# Patient Record
Sex: Male | Born: 1980 | Race: White | Hispanic: No | Marital: Married | Smoking: Current every day smoker
Health system: Southern US, Community
[De-identification: ages and names within clinical notes are randomized; demographics above are authoritative.]

## PROBLEM LIST (undated history)

## (undated) DIAGNOSIS — F32A Depression, unspecified: Secondary | ICD-10-CM

## (undated) DIAGNOSIS — F329 Major depressive disorder, single episode, unspecified: Secondary | ICD-10-CM

## (undated) DIAGNOSIS — R011 Cardiac murmur, unspecified: Secondary | ICD-10-CM

## (undated) HISTORY — DX: Cardiac murmur, unspecified: R01.1

## (undated) HISTORY — DX: Depression, unspecified: F32.A

## (undated) HISTORY — DX: Major depressive disorder, single episode, unspecified: F32.9

---

## 2012-09-16 ENCOUNTER — Encounter: Payer: Self-pay | Admitting: Family Medicine

## 2012-09-16 ENCOUNTER — Ambulatory Visit (INDEPENDENT_AMBULATORY_CARE_PROVIDER_SITE_OTHER): Payer: PRIVATE HEALTH INSURANCE | Admitting: Family Medicine

## 2012-09-16 VITALS — BP 140/96 | HR 75 | Temp 99.0°F | Ht 76.0 in | Wt 220.5 lb

## 2012-09-16 DIAGNOSIS — Z131 Encounter for screening for diabetes mellitus: Secondary | ICD-10-CM

## 2012-09-16 DIAGNOSIS — R5383 Other fatigue: Secondary | ICD-10-CM

## 2012-09-16 DIAGNOSIS — Z13220 Encounter for screening for lipoid disorders: Secondary | ICD-10-CM

## 2012-09-16 DIAGNOSIS — R5381 Other malaise: Secondary | ICD-10-CM

## 2012-09-16 DIAGNOSIS — F329 Major depressive disorder, single episode, unspecified: Secondary | ICD-10-CM

## 2012-09-16 DIAGNOSIS — Z0000 Encounter for general adult medical examination without abnormal findings: Secondary | ICD-10-CM

## 2012-09-16 MED ORDER — FLUTICASONE PROPIONATE 50 MCG/ACT NA SUSP: 2.0000 | Freq: Every day | NASAL | Status: DC

## 2012-09-16 MED ORDER — TERBINAFINE HCL 250 MG PO TABS: 250.0000 mg | ORAL_TABLET | Freq: Every day | ORAL | Status: DC

## 2012-09-16 MED ORDER — FLUOXETINE HCL 20 MG PO TABS: 20.0000 mg | ORAL_TABLET | Freq: Every day | ORAL | Status: DC

## 2012-09-16 NOTE — Progress Notes (Signed)
Nature conservation officer at [facility] [address] [place] [address] Phone: [phone] Fax: [phone]  Date:  09/16/2012   Name:  Douglas Garza   DOB:  11-25-1980   MRN:  [id number] Gender: male Age: 32 y.o.  Primary Physician:  Hannah Beat, MD  Evaluating MD: Hannah Beat, MD   Chief Complaint: Establish Care   History of Present Illness:  Douglas Garza is a 32 y.o. pleasant patient who presents with the following:  Preventative Health Maintenance Visit:  Health Maintenance Summary Reviewed and updated, unless pt declines services.  Tobacco History Reviewed. Alcohol: No concerns, no excessive use Exercise Habits: Some activity, maybe once a week STD concerns: no risk or activity to increase risk Drug Use: None Encouraged self-testicular check  Rash - on underarms. Not completely gone.   Has a nasal thing. Maybe had a sinus infection. Funny -- initially was right only. Sometimes will have pain in teeth. Some ear fullness.  Went away for a week.   Having a lot of stress right now. Every once  In 10-11, on wellbutrin, placed.  Stressed a lot at work.  Wife and 2 boys, 3 and 11 months. Wife stays at home.  Not sleeping much.  Decreased interest and energy.  Unsure of guilt.    There are no active problems to display for this patient.   Past Medical History  Diagnosis Date  . Depression   . Heart murmur     No past surgical history on file.  History   Social History  . Marital Status: Married    Spouse Name: N/A    Number of Children: N/A  . Years of Education: N/A   Occupational History  . Not on file.   Social History Main Topics  . Smoking status: Current Every Day Smoker  . Smokeless tobacco: Never Used  . Alcohol Use: Yes  . Drug Use: No  . Sexually Active: Not on file   Other Topics Concern  . Not on file   Social History Narrative  . No narrative on file    No family history on file.  No Known  Allergies  Medication list has been reviewed and updated.  No outpatient prescriptions prior to visit.   No facility-administered medications prior to visit.    Review of Systems:   General: Denies fever, chills, sweats. No significant weight loss. Eyes: Denies blurring,significant itching ENT: Denies earache, sore throat, and hoarseness. Cardiovascular: Denies chest pains, palpitations, dyspnea on exertion Respiratory: Denies cough, dyspnea at rest,wheeezing Breast: no concerns about lumps GI: Denies nausea, vomiting, diarrhea, constipation, change in bowel habits, abdominal pain, melena, hematochezia GU: Denies penile discharge, ED, urinary flow / outflow problems. No STD concerns. Musculoskeletal: Denies back pain, joint pain Derm: rash as above Neuro: Denies  paresthesias, frequent falls, frequent headaches Psych: as above Endocrine: Denies cold intolerance, heat intolerance, polydipsia Heme: Denies enlarged lymph nodes Allergy: No hayfever   Physical Examination: BP 140/96  Pulse 75  Temp(Src) 99 F (37.2 C) (Oral)  Ht 6\' 4"  (1.93 m)  Wt 220 lb 8 oz (100.018 kg)  BMI 26.85 kg/m2  SpO2 99%  Ideal Body Weight: Weight in (lb) to have BMI = 25: 205   Wt Readings from Last 3 Encounters:  09/16/12 220 lb 8 oz (100.018 kg)    GEN: well developed, well nourished, no acute distress Eyes: conjunctiva and lids normal, PERRLA, EOMI ENT: TM clear, nares clear, oral exam WNL Neck: supple, no lymphadenopathy, no thyromegaly,  no JVD Pulm: clear to auscultation and percussion, respiratory effort normal CV: regular rate and rhythm, S1-S2, no murmur, rub or gallop, no bruits, peripheral pulses normal and symmetric, no cyanosis, clubbing, edema or varicosities Chest: no scars, masses GI: soft, non-tender; no hepatosplenomegaly, masses; active bowel sounds all quadrants GU: defer Lymph: no cervical, axillary or inguinal adenopathy MSK: gait normal, muscle tone and strength  WNL, no joint swelling, effusions, discoloration, crepitus  SKIN: macular rash under arms Neuro: normal mental status, normal strength, sensation, and motion Psych: alert; oriented to person, place and time, normally interactive and not anxious or depressed in appearance.   Assessment and Plan:  Routine general medical examination at a health care facility  Depression - Plan: FLUoxetine (PROZAC) 20 MG tablet, Ambulatory referral to Psychiatry  Screening for lipoid disorders - Plan: Lipid panel  Screening for diabetes mellitus - Plan: Basic metabolic panel  Other malaise and fatigue - Plan: Hepatic function panel, CBC with Differential  The patient's preventative maintenance and recommended screening tests for an annual wellness exam were reviewed in full today. Brought up to date unless services declined.  Counselled on the importance of diet, exercise, and its role in overall health and mortality. The patient's FH and SH was reviewed, including their home life, tobacco status, and drug and alcohol status.   Dep: concerns with significant new onset depression with fleeting thoughts of suicide. Start prozac. I would like to get psychiatry involved in this case, since patient has passive suicidality.  lamisil for fungus. Check lft  flonase for nose  Orders Today:  Orders Placed This Encounter  Procedures  . Hepatic function panel  . Basic metabolic panel  . CBC with Differential  . Lipid panel  . Ambulatory referral to Psychiatry    Referral Priority:  Routine    Referral Type:  Psychiatric    Referral Reason:  Specialty Services Required    Requested Specialty:  Psychiatry    Number of Visits Requested:  1    Updated Medication List: (Includes new medications, updates to list, dose adjustments) Meds ordered this encounter  Medications  . fluticasone (FLONASE) 50 MCG/ACT nasal spray    Sig: Place 2 sprays into the nose daily.    Dispense:  16 g    Refill:  12  .  FLUoxetine (PROZAC) 20 MG tablet    Sig: Take 1 tablet (20 mg total) by mouth daily.    Dispense:  30 tablet    Refill:  1  . terbinafine (LAMISIL) 250 MG tablet    Sig: Take 1 tablet (250 mg total) by mouth daily.    Dispense:  30 tablet    Refill:  1    Medications Discontinued: There are no discontinued medications.    Signed, Elpidio Galea. Oluwatamilore Starnes, MD 09/16/2012 2:08 PM

## 2012-09-17 ENCOUNTER — Encounter: Payer: Self-pay | Admitting: *Deleted

## 2012-09-17 LAB — BASIC METABOLIC PANEL
CO2: 23 mEq/L (ref 19–32)
Calcium: 10.2 mg/dL (ref 8.4–10.5)
Creatinine, Ser: 1 mg/dL (ref 0.4–1.5)
Glucose, Bld: 80 mg/dL (ref 70–99)
Sodium: 139 mEq/L (ref 135–145)

## 2012-09-17 LAB — CBC WITH DIFFERENTIAL/PLATELET
Basophils Absolute: 0 10*3/uL (ref 0.0–0.1)
Eosinophils Absolute: 0.1 10*3/uL (ref 0.0–0.7)
Hemoglobin: 14.3 g/dL (ref 13.0–17.0)
Lymphocytes Relative: 24.5 % (ref 12.0–46.0)
MCHC: 33 g/dL (ref 30.0–36.0)
Monocytes Absolute: 0.5 10*3/uL (ref 0.1–1.0)
Neutro Abs: 5.9 10*3/uL (ref 1.4–7.7)
RDW: 12.9 % (ref 11.5–14.6)

## 2012-09-17 LAB — HEPATIC FUNCTION PANEL
AST: 21 U/L (ref 0–37)
Alkaline Phosphatase: 49 U/L (ref 39–117)
Total Bilirubin: 0.5 mg/dL (ref 0.3–1.2)

## 2012-09-17 LAB — LIPID PANEL: HDL: 35 mg/dL — ABNORMAL LOW (ref >39.00)

## 2016-11-22 ENCOUNTER — Ambulatory Visit (INDEPENDENT_AMBULATORY_CARE_PROVIDER_SITE_OTHER): Payer: BLUE CROSS/BLUE SHIELD | Admitting: Internal Medicine

## 2016-11-22 ENCOUNTER — Encounter: Payer: Self-pay | Admitting: Internal Medicine

## 2016-11-22 VITALS — BP 142/98 | HR 81 | Temp 98.6°F | Wt 238.0 lb

## 2016-11-22 DIAGNOSIS — R030 Elevated blood-pressure reading, without diagnosis of hypertension: Secondary | ICD-10-CM

## 2016-11-22 DIAGNOSIS — F419 Anxiety disorder, unspecified: Secondary | ICD-10-CM | POA: Diagnosis not present

## 2016-11-22 DIAGNOSIS — F329 Major depressive disorder, single episode, unspecified: Secondary | ICD-10-CM | POA: Diagnosis not present

## 2016-11-22 MED ORDER — FLUOXETINE HCL 20 MG PO TABS: ORAL_TABLET | ORAL | 2 refills | Status: DC

## 2016-11-22 NOTE — Patient Instructions (Signed)

## 2016-11-22 NOTE — Progress Notes (Signed)
HPI  Pt presents to the clinic today to establish care and for management of the conditions listed below. He has not had a PCP in 4 years.  Anxiety and Depression: Triggered by work stress. He has had panic attacks in the past. He was on Prozac many years ago and he feels like he would like to restart this. He has had thoughts about harming himself in the past, but not currently. He has never come up with a concrete plan and has no intention of harming himself or others. He is not interested in therapy. He would also like a referral to psychiatry.  Of note, his BP today is 142/98. He has never been diagnosed with HTN in the past although he has a BP in Epic of 140/96 from 2014.  Flu: never Tetanus: > 10 years ago Dentist: as needed  Past Medical History:  Diagnosis Date  . Depression   . Heart murmur     No current outpatient prescriptions on file.   No current facility-administered medications for this visit.     No Known Allergies  Family History  Problem Relation Age of Onset  . Breast cancer Mother   . Drug abuse Brother   . Alcohol abuse Maternal Grandmother   . Lung cancer Maternal Grandfather   . Stomach cancer Paternal Grandmother   . Brain cancer Paternal Grandfather     Social History   Social History  . Marital status: Married    Spouse name: N/A  . Number of children: N/A  . Years of education: N/A   Occupational History  . Not on file.   Social History Main Topics  . Smoking status: Current Some Day Smoker    Types: E-cigarettes  . Smokeless tobacco: Never Used     Comment: Daily  . Alcohol use Yes     Comment: occasional  . Drug use: No  . Sexual activity: Not on file   Other Topics Concern  . Not on file   Social History Narrative  . No narrative on file    ROS:  Constitutional: Denies fever, malaise, fatigue, headache or abrupt weight changes.  Respiratory: Denies difficulty breathing, shortness of breath, cough or sputum production.    Cardiovascular: Denies chest pain, chest tightness, palpitations or swelling in the hands or feet.  Neurological: Denies dizziness, difficulty with memory, difficulty with speech or problems with balance and coordination.  Psych: Pt reports anxiety and depression. Denies SI/HI.  No other specific complaints in a complete review of systems (except as listed in HPI above).  PE:  BP (!) 142/98   Pulse 81   Temp 98.6 F (37 C) (Oral)   Wt 238 lb (108 kg)   SpO2 98%   BMI 28.97 kg/m   Wt Readings from Last 3 Encounters:  09/16/12 220 lb 8 oz (100 kg)    General: Appears his stated age, well developed, well nourished in NAD. Skin: Dry and intact. Cardiovascular: Normal rate and rhythm.  Pulmonary/Chest: Normal effort and positive vesicular breath sounds. No respiratory distress. No wheezes, rales or ronchi noted.  Neurological: Alert and oriented.  Psychiatric: He seems very nervous today. Judgement and thought content are normal.  BMET    Component Value Date/Time   NA 139 09/16/2012 1440   K 4.3 09/16/2012 1440   CL 105 09/16/2012 1440   CO2 23 09/16/2012 1440   GLUCOSE 80 09/16/2012 1440   BUN 10 09/16/2012 1440   CREATININE 1.0 09/16/2012 1440   CALCIUM  10.2 09/16/2012 1440    Lipid Panel     Component Value Date/Time   CHOL 134 09/16/2012 1440   TRIG 107.0 09/16/2012 1440   HDL 35.00 (L) 09/16/2012 1440   CHOLHDL 4 09/16/2012 1440   VLDL 21.4 09/16/2012 1440   LDLCALC 78 09/16/2012 1440    CBC    Component Value Date/Time   WBC 8.8 09/16/2012 1440   RBC 4.84 09/16/2012 1440   HGB 14.3 09/16/2012 1440   HCT 43.2 09/16/2012 1440   PLT 307.0 09/16/2012 1440   MCV 89.2 09/16/2012 1440   MCHC 33.0 09/16/2012 1440   RDW 12.9 09/16/2012 1440   LYMPHSABS 2.1 09/16/2012 1440   MONOABS 0.5 09/16/2012 1440   EOSABS 0.1 09/16/2012 1440   BASOSABS 0.0 09/16/2012 1440    Hgb A1C No results found for: HGBA1C   Assessment and Plan:  Elevated Blood  Pressure:  He feels this is stress related Will restart his Prozac and recheck BP in 6 weeks  RTC in 6 weeks to recheck BP Chapman Matteucci, NP

## 2016-11-22 NOTE — Assessment & Plan Note (Signed)
Support offered today Will restart Prozac, eRx sent to pharmacy He declines referral for therapy Referral placed to psychiatry per his request

## 2017-01-03 ENCOUNTER — Encounter: Payer: Self-pay | Admitting: Internal Medicine

## 2017-01-03 ENCOUNTER — Ambulatory Visit (INDEPENDENT_AMBULATORY_CARE_PROVIDER_SITE_OTHER): Payer: BLUE CROSS/BLUE SHIELD | Admitting: Internal Medicine

## 2017-01-03 VITALS — BP 142/86 | HR 67 | Temp 98.1°F | Wt 238.0 lb

## 2017-01-03 DIAGNOSIS — F419 Anxiety disorder, unspecified: Secondary | ICD-10-CM

## 2017-01-03 DIAGNOSIS — F329 Major depressive disorder, single episode, unspecified: Secondary | ICD-10-CM | POA: Diagnosis not present

## 2017-01-03 MED ORDER — BUPROPION HCL ER (XL) 150 MG PO TB24: 150.0000 mg | ORAL_TABLET | Freq: Every day | ORAL | 2 refills | Status: AC

## 2017-01-03 NOTE — Progress Notes (Signed)
Subjective:    Patient ID: Douglas Garza, male    DOB: [birth year], 36 y.o.   MRN: [id number]  HPI  Pt presents to the clinic today for follow up anxiety and depression. He was started on Prozac at his last visit. Since that time, he feels like his mood is all over the place. He reports his anxiety has leveled off but his depression has not. He also reports difficulty sleeping since starting the Prozac. He has taken Prozac in the past with good results. He denies SI/HI.   Review of Systems      Past Medical History:  Diagnosis Date  . Depression   . Heart murmur     Current Outpatient Prescriptions  Medication Sig Dispense Refill  . FLUoxetine (PROZAC) 20 MG tablet Take 1/2 tab daily x 2 weeks then increase to a whole tab thereafter 30 tablet 2   No current facility-administered medications for this visit.     No Known Allergies  Family History  Problem Relation Age of Onset  . Breast cancer Mother   . Drug abuse Brother   . Alcohol abuse Maternal Grandmother   . Lung cancer Maternal Grandfather   . Stomach cancer Paternal Grandmother   . Brain cancer Paternal Grandfather     Social History   Social History  . Marital status: Married    Spouse name: N/A  . Number of children: N/A  . Years of education: N/A   Occupational History  . Not on file.   Social History Main Topics  . Smoking status: Current Some Day Smoker    Types: E-cigarettes  . Smokeless tobacco: Never Used     Comment: Daily  . Alcohol use 4.8 oz/week    8 Cans of beer per week     Comment: occasional  . Drug use: No  . Sexual activity: Yes   Other Topics Concern  . Not on file   Social History Narrative  . No narrative on file     Constitutional: Denies fever, malaise, fatigue, headache or abrupt weight changes.  Neurological: Pt reports insomnia. Denies dizziness, difficulty with memory, difficulty with speech or problems with balance and coordination.  Psych: Pt reports anxiety  and depression. Denies SI/HI.  No other specific complaints in a complete review of systems (except as listed in HPI above).  Objective:   Physical Exam   BP (!) 142/86   Pulse 67   Temp 98.1 F (36.7 C) (Oral)   Wt 238 lb (108 kg)   SpO2 97%   BMI 28.97 kg/m  Wt Readings from Last 3 Encounters:  01/03/17 238 lb (108 kg)  11/22/16 238 lb (108 kg)  09/16/12 220 lb 8 oz (100 kg)    General: Appears his stated age, in NAD. Neurological: Alert and oriented.  Psychiatric: He is mildly anxious appearing today. Behavior is normal. Judgment and thought content normal.    BMET    Component Value Date/Time   NA 139 09/16/2012 1440   K 4.3 09/16/2012 1440   CL 105 09/16/2012 1440   CO2 23 09/16/2012 1440   GLUCOSE 80 09/16/2012 1440   BUN 10 09/16/2012 1440   CREATININE 1.0 09/16/2012 1440   CALCIUM 10.2 09/16/2012 1440    Lipid Panel     Component Value Date/Time   CHOL 134 09/16/2012 1440   TRIG 107.0 09/16/2012 1440   HDL 35.00 (L) 09/16/2012 1440   CHOLHDL 4 09/16/2012 1440   VLDL 21.4 09/16/2012 1440  LDLCALC 78 09/16/2012 1440    CBC    Component Value Date/Time   WBC 8.8 09/16/2012 1440   RBC 4.84 09/16/2012 1440   HGB 14.3 09/16/2012 1440   HCT 43.2 09/16/2012 1440   PLT 307.0 09/16/2012 1440   MCV 89.2 09/16/2012 1440   MCHC 33.0 09/16/2012 1440   RDW 12.9 09/16/2012 1440   LYMPHSABS 2.1 09/16/2012 1440   MONOABS 0.5 09/16/2012 1440   EOSABS 0.1 09/16/2012 1440   BASOSABS 0.0 09/16/2012 1440    Hgb A1C No results found for: HGBA1C         Assessment & Plan:   Anxiety and Depression:  Persistent Continue Prozac 20 mg daily Start Wellbutrin 150 mg daily, eRx sent to pharmacy Support offered today  Follow up with me in 4 weeks via mychart and let me know how you are doing Nicki Reaper, NP

## 2017-01-04 NOTE — Patient Instructions (Signed)
Persistent Depressive Disorder Persistent depressive disorder (PDD) is a mental health condition. PDD causes symptoms of low-level depression for 2 years or longer. It may also be called long-term (chronic) depression or dysthymia. PDD may include episodes of more severe depression that last for about 2 weeks (major depressive disorder or MDD). PDD can affect the way you think, feel, and sleep. This condition may also affect your relationships. You may be more likely to get sick if you have PDD. Symptoms of PDD occur for most of the day and may include:  Feeling tired (fatigue).  Low energy.  Eating too much or too little.  Sleeping too much or too little.  Feeling restless or agitated.  Feeling hopeless.  Feeling worthless or guilty.  Feeling worried or nervous (anxiety).  Trouble concentrating or making decisions.  Low self-esteem.  A negative way of looking at things (outlook).  Not being able to have fun or feel pleasure.  Avoiding interacting with people.  Getting angry or annoyed easily (irritability).  Acting aggressive or angry.  Follow these instructions at home: Activity  Go back to your normal activities as told by your doctor.  Exercise regularly as told by your doctor. General instructions  Take over-the-counter and prescription medicines only as told by your doctor.  Do not drink alcohol. Or, limit how much alcohol you drink to no more than 1 drink a day for nonpregnant women and 2 drinks a day for men. One drink equals 12 oz of beer, 5 oz of wine, or 1 oz of hard liquor. Alcohol can affect any antidepressant medicines you are taking. Talk with your doctor about your alcohol use.  Eat a healthy diet and get plenty of sleep.  Find activities that you enjoy each day.  Consider joining a support group. Your doctor may be able to suggest a support group.  Keep all follow-up visits as told by your doctor. This is important. Where to find more  information: National Alliance on Mental Illness  www.nami.org  U.S. National Institute of Mental Health  www.nimh.nih.gov  National Suicide Prevention Lifeline  1-800-273-TALK (1-800-273-8255). This is free, 24-hour help.  Contact a doctor if:  Your symptoms get worse.  You have new symptoms.  You have trouble sleeping or doing your daily activities. Get help right away if:  You self-harm.  You have serious thoughts about hurting yourself or others.  You see, hear, taste, smell, or feel things that are not there (hallucinate). This information is not intended to replace advice given to you by your health care provider. Make sure you discuss any questions you have with your health care provider. Document Released: 03/08/2015 Document Revised: 11/19/2015 Document Reviewed: 11/19/2015 Elsevier Interactive Patient Education  2017 Elsevier Inc.  

## 2017-01-13 ENCOUNTER — Emergency Department
Admission: EM | Admit: 2017-01-13 | Discharge: 2017-01-13 | Disposition: A | Discharge status: Home / Self Care | Payer: BLUE CROSS/BLUE SHIELD | Attending: Student in an Organized Health Care Education/Training Program | Admitting: Student in an Organized Health Care Education/Training Program

## 2017-01-13 ENCOUNTER — Emergency Department: Payer: BLUE CROSS/BLUE SHIELD

## 2017-01-13 ENCOUNTER — Encounter: Payer: Self-pay | Admitting: Emergency Medicine

## 2017-01-13 DIAGNOSIS — Z79899 Other long term (current) drug therapy: Secondary | ICD-10-CM | POA: Diagnosis not present

## 2017-01-13 DIAGNOSIS — F17290 Nicotine dependence, other tobacco product, uncomplicated: Secondary | ICD-10-CM | POA: Insufficient documentation

## 2017-01-13 DIAGNOSIS — M79604 Pain in right leg: Secondary | ICD-10-CM | POA: Diagnosis not present

## 2017-01-13 DIAGNOSIS — R002 Palpitations: Secondary | ICD-10-CM | POA: Insufficient documentation

## 2017-01-13 LAB — CBC WITH DIFFERENTIAL/PLATELET
BASOS ABS: 0.1 10*3/uL (ref 0–0.1)
BASOS PCT: 1 %
EOS ABS: 0.5 10*3/uL (ref 0–0.7)
Eosinophils Relative: 8 %
HEMATOCRIT: 41.1 % (ref 40.0–52.0)
HEMOGLOBIN: 14 g/dL (ref 13.0–18.0)
Lymphocytes Relative: 28 %
Lymphs Abs: 1.9 10*3/uL (ref 1.0–3.6)
MCH: 29.3 pg (ref 26.0–34.0)
MCHC: 33.9 g/dL (ref 32.0–36.0)
MCV: 86.3 fL (ref 80.0–100.0)
Monocytes Absolute: 0.5 10*3/uL (ref 0.2–1.0)
Monocytes Relative: 8 %
NEUTROS ABS: 3.7 10*3/uL (ref 1.4–6.5)
NEUTROS PCT: 55 %
Platelets: 315 10*3/uL (ref 150–440)
RBC: 4.76 MIL/uL (ref 4.40–5.90)
RDW: 12.5 % (ref 11.5–14.5)
WBC: 6.6 10*3/uL (ref 3.8–10.6)

## 2017-01-13 LAB — BASIC METABOLIC PANEL WITH GFR
Anion gap: 9 (ref 5–15)
BUN: 9 mg/dL (ref 6–20)
CO2: 25 mmol/L (ref 22–32)
Calcium: 9.5 mg/dL (ref 8.9–10.3)
Chloride: 104 mmol/L (ref 101–111)
Creatinine, Ser: 1.25 mg/dL — ABNORMAL HIGH (ref 0.61–1.24)
GFR calc Af Amer: >60 mL/min
GFR calc non Af Amer: >60 mL/min
Glucose, Bld: 113 mg/dL — ABNORMAL HIGH (ref 65–99)
Potassium: 4 mmol/L (ref 3.5–5.1)
Sodium: 138 mmol/L (ref 135–145)

## 2017-01-13 LAB — TROPONIN I: Troponin I: <0.03 ng/mL

## 2017-01-13 LAB — FIBRIN DERIVATIVES D-DIMER (ARMC ONLY): Fibrin derivatives D-dimer (ARMC): 203.41 ng{FEU}/mL (ref 0.00–499.00)

## 2017-01-13 MED ORDER — LORAZEPAM 2 MG/ML IJ SOLN
0.5000 mg | Freq: Once | INTRAMUSCULAR | Status: DC
Start: 2017-01-13 — End: 2017-01-13
  Filled 2017-01-13: qty 1

## 2017-01-13 NOTE — ED Notes (Signed)
Xray in room to get pt. Pt diaphoretic and states thinks he is having a panic attack. Pt has hx of anxiety. Roxan Hockey MD in emergency at this time. Instructed xray to return in 20 mins for CXR. Will continue to monitor for improvement.

## 2017-01-13 NOTE — ED Triage Notes (Signed)
Pt arrived via POV from home with reports of right calf pain since Thursday, shortness of breath and heart palpitations.  Pt states the pain is worse after activity and is intermittent. Pt describes the pain as a deep pain.  Pt has no prior hx of blood clots and no recent long periods of travel. Pt states he sits at work a lot. Pt does have a hx of heart murmur.

## 2017-01-13 NOTE — ED Provider Notes (Signed)
[facility] Emergency Department Provider Note    None    (approximate)  I have reviewed the triage vital signs and the nursing notes.   HISTORY  Chief Complaint Leg Pain; Palpitations; and Shortness of Breath    HPI Douglas Garza is a 36 y.o. male his acute complaint of right calf pain since Thursday associated with palpitations and shortness of breath. Patient does have a history of anxiety impression. Is currently on Wellbutrin which is not any medication for him. Denies any fevers. No pleuritic chest pain. No positional chest pain. No previous history of blood clots or long travel. He is a daily smoker. Family history of blood clots. States the pain is an achy sensation and intermittent.   Past Medical History:  Diagnosis Date  . Depression   . Heart murmur    Family History  Problem Relation Age of Onset  . Breast cancer Mother   . Drug abuse Brother   . Alcohol abuse Maternal Grandmother   . Lung cancer Maternal Grandfather   . Stomach cancer Paternal Grandmother   . Brain cancer Paternal Grandfather    History reviewed. No pertinent surgical history. Patient Active Problem List   Diagnosis Date Noted  . Anxiety and depression 09/16/2012      Prior to Admission medications   Medication Sig Start Date End Date Taking? Authorizing Provider  buPROPion (WELLBUTRIN XL) 150 MG 24 hr tablet Take 1 tablet (150 mg total) by mouth daily. 01/03/17   Lorre Munroe, NP  FLUoxetine (PROZAC) 20 MG tablet Take 20 mg by mouth daily.    [provider]    Allergies Patient has no known allergies.    Social History Social History  Substance Use Topics  . Smoking status: Current Every Day Smoker    Types: E-cigarettes  . Smokeless tobacco: Never Used     Comment: Daily  . Alcohol use 4.8 oz/week    8 Cans of beer per week     Comment: occasional    Review of Systems Patient denies headaches, rhinorrhea, blurry vision,  numbness, shortness of breath, chest pain, edema, cough, abdominal pain, nausea, vomiting, diarrhea, dysuria, fevers, rashes or hallucinations unless otherwise stated above in HPI. ____________________________________________   PHYSICAL EXAM:  VITAL SIGNS: Vitals:   01/13/17 1144  BP: (!) 151/93  Pulse: 97  Resp: 20  Temp: 98.3 F (36.8 C)  SpO2: 98%    Constitutional: Alert and oriented. Well appearing and in no acute distress. Eyes: Conjunctivae are normal.  Head: Atraumatic. Nose: No congestion/rhinnorhea. Mouth/Throat: Mucous membranes are moist.   Neck: No stridor. Painless ROM.  Cardiovascular: Normal rate, regular rhythm. Grossly normal heart sounds.  Good peripheral circulation. Respiratory: Normal respiratory effort.  No retractions. Lungs CTAB. Gastrointestinal: Soft and nontender. No distention. No abdominal bruits. No CVA tenderness. Genitourinary:  Musculoskeletal: No lower extremity tenderness nor edema.  No joint effusions. Neurologic:  Normal speech and language. No gross focal neurologic deficits are appreciated. No facial droop Skin:  Skin is warm, dry and intact. No rash noted. Psychiatric: Mood and affect are normal. Speech and behavior are normal.  ____________________________________________   LABS (all labs ordered are listed, but only abnormal results are displayed)  No results found for this or any previous visit (from the past 24 hour(s)). ____________________________________________  EKG My review and personal interpretation at Time: 11:38   Indication: chest pain, sob,   Rate: 100  Rhythm: sinus Axis: normal Other: normal intervals, no stemi, ____________________________________________  RADIOLOGY  I personally reviewed all radiographic images ordered to evaluate for the above acute complaints and reviewed radiology reports and findings.  These findings were personally discussed with the patient.  Please see medical record for radiology  report. ____________________________________________   PROCEDURES  Procedure(s) performed:  Procedures    Critical Care performed: no ____________________________________________   INITIAL IMPRESSION / ASSESSMENT AND PLAN / ED COURSE  Pertinent labs & imaging results that were available during my care of the patient were reviewed by me and considered in my medical decision making (see chart for details).  DDX: ACS, pericarditis, esophagitis, boerhaaves, pe, dissection, pna, bronchitis, costochondritis   Douglas Garza is a 36 y.o. who presents to the ED with Pain palpitations and shortness of breath as described above.  Patient is AFVSS in ED. Exam as above. Given current presentation have considered the above differential.  Patient is low risk by well's based on his mild tachycardia d-dimer was ordered to further risk stratify for PE. Ultrasound lower extremity shows no evidence of DVT. D-dimer is normal. EKG shows no evidence of ischemia. pain is negative.  CXR without ptx, pnc, chf.  Blood work is otherwise reassuring.  Have discussed with the patient and available family all diagnostics and treatments performed thus far and all questions were answered to the best of my ability. The patient demonstrates understanding and agreement with plan.      ____________________________________________   FINAL CLINICAL IMPRESSION(S) / ED DIAGNOSES  Final diagnoses:  Palpitations  Right leg pain      NEW MEDICATIONS STARTED DURING THIS VISIT:  New Prescriptions   No medications on file     Note:  This document was prepared using Dragon voice recognition software and may include unintentional dictation errors.    Willy Eddy, MD 01/13/17 1304

## 2017-01-13 NOTE — ED Notes (Signed)
ED Provider at bedside. 

## 2017-02-16 ENCOUNTER — Other Ambulatory Visit: Payer: Self-pay

## 2017-02-16 MED ORDER — FLUOXETINE HCL 20 MG PO CAPS: 20.0000 mg | ORAL_CAPSULE | Freq: Every day | ORAL | 3 refills | Status: AC

## 2017-08-27 ENCOUNTER — Encounter: Payer: Self-pay | Admitting: Internal Medicine

## 2019-02-18 IMAGING — CR DG CHEST 2V
2 series · 2 of 2 positions shown · non-contrast
Comparison: None.

CLINICAL DATA: Leg pain and swelling.

EXAM:
CHEST  2 VIEW

[chest pa]
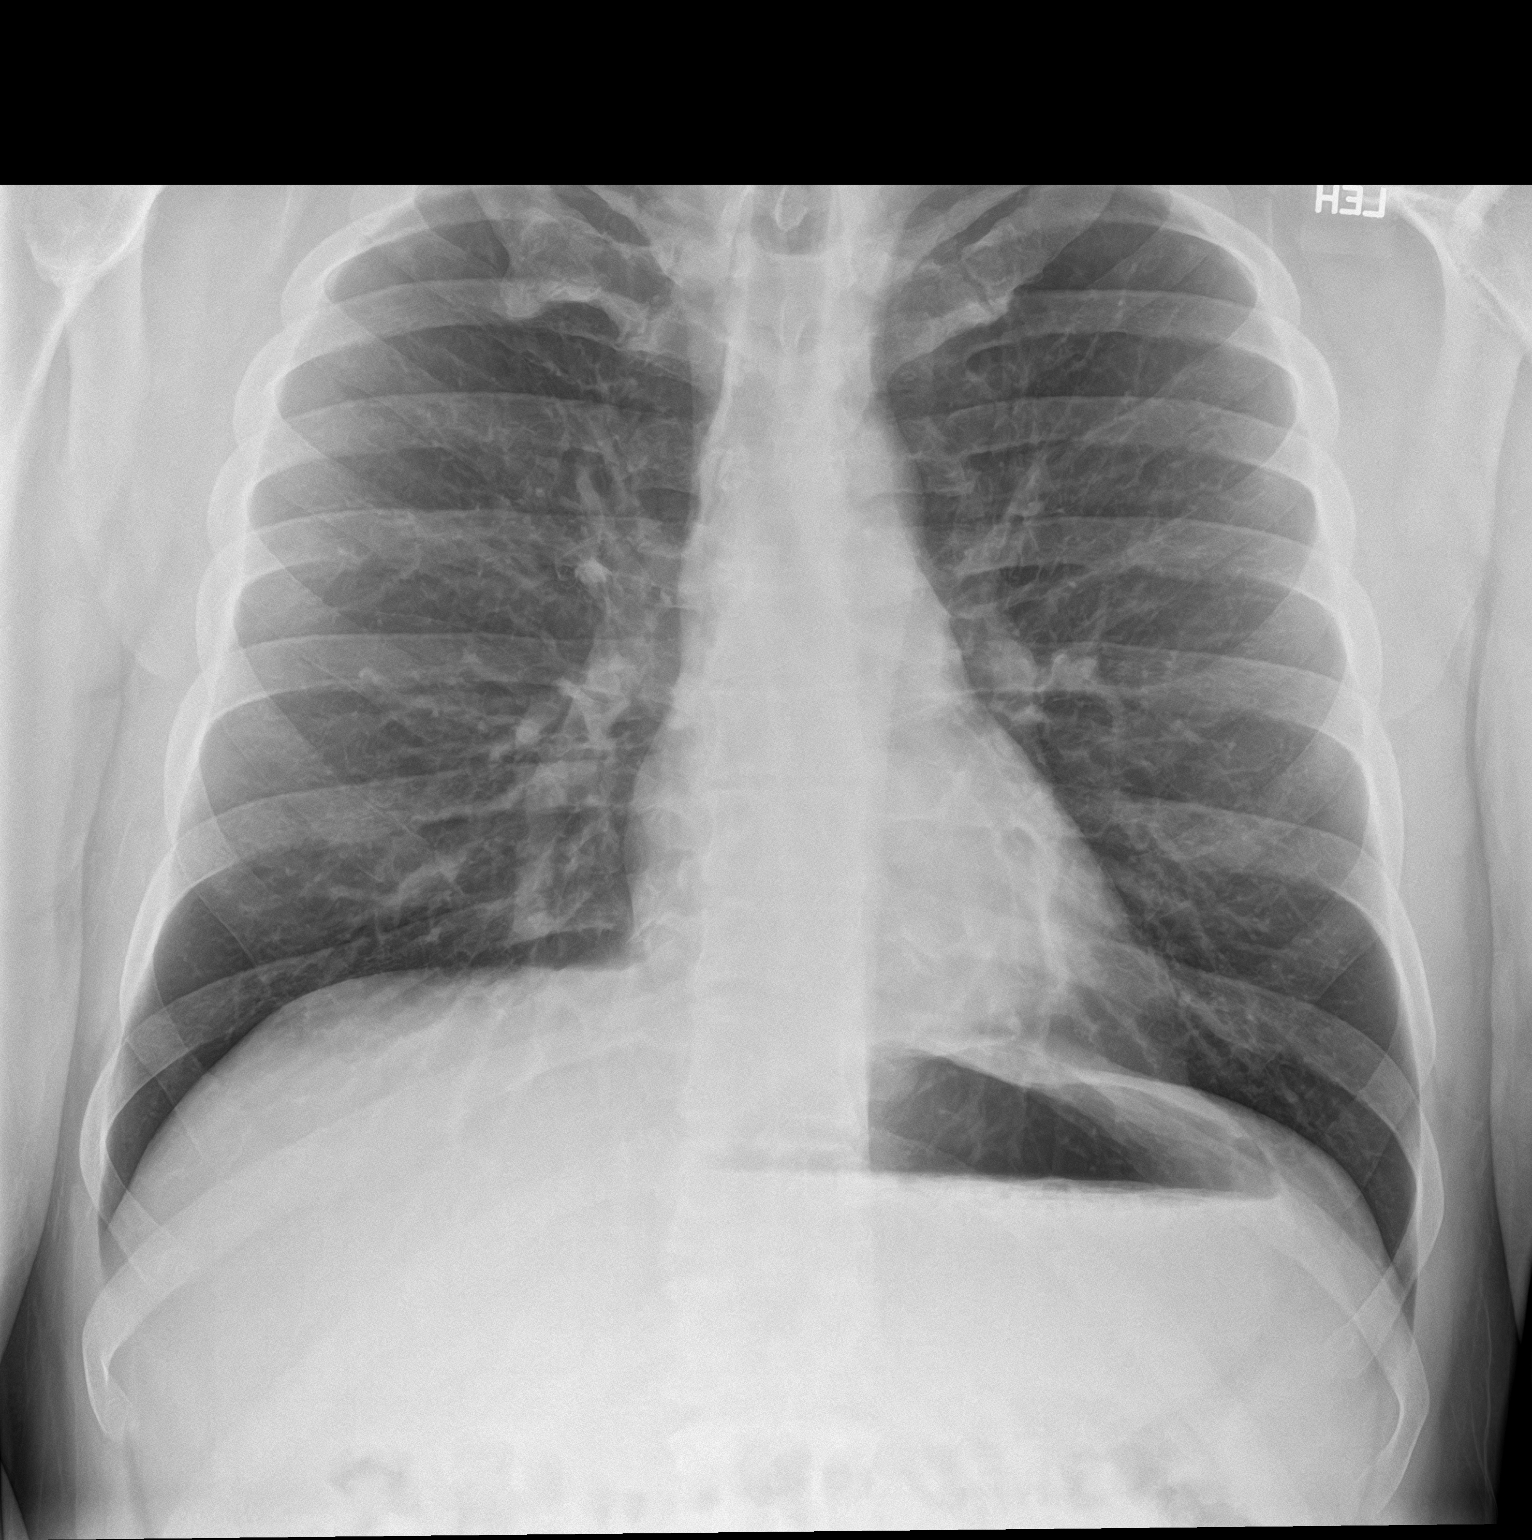

[chest lat]
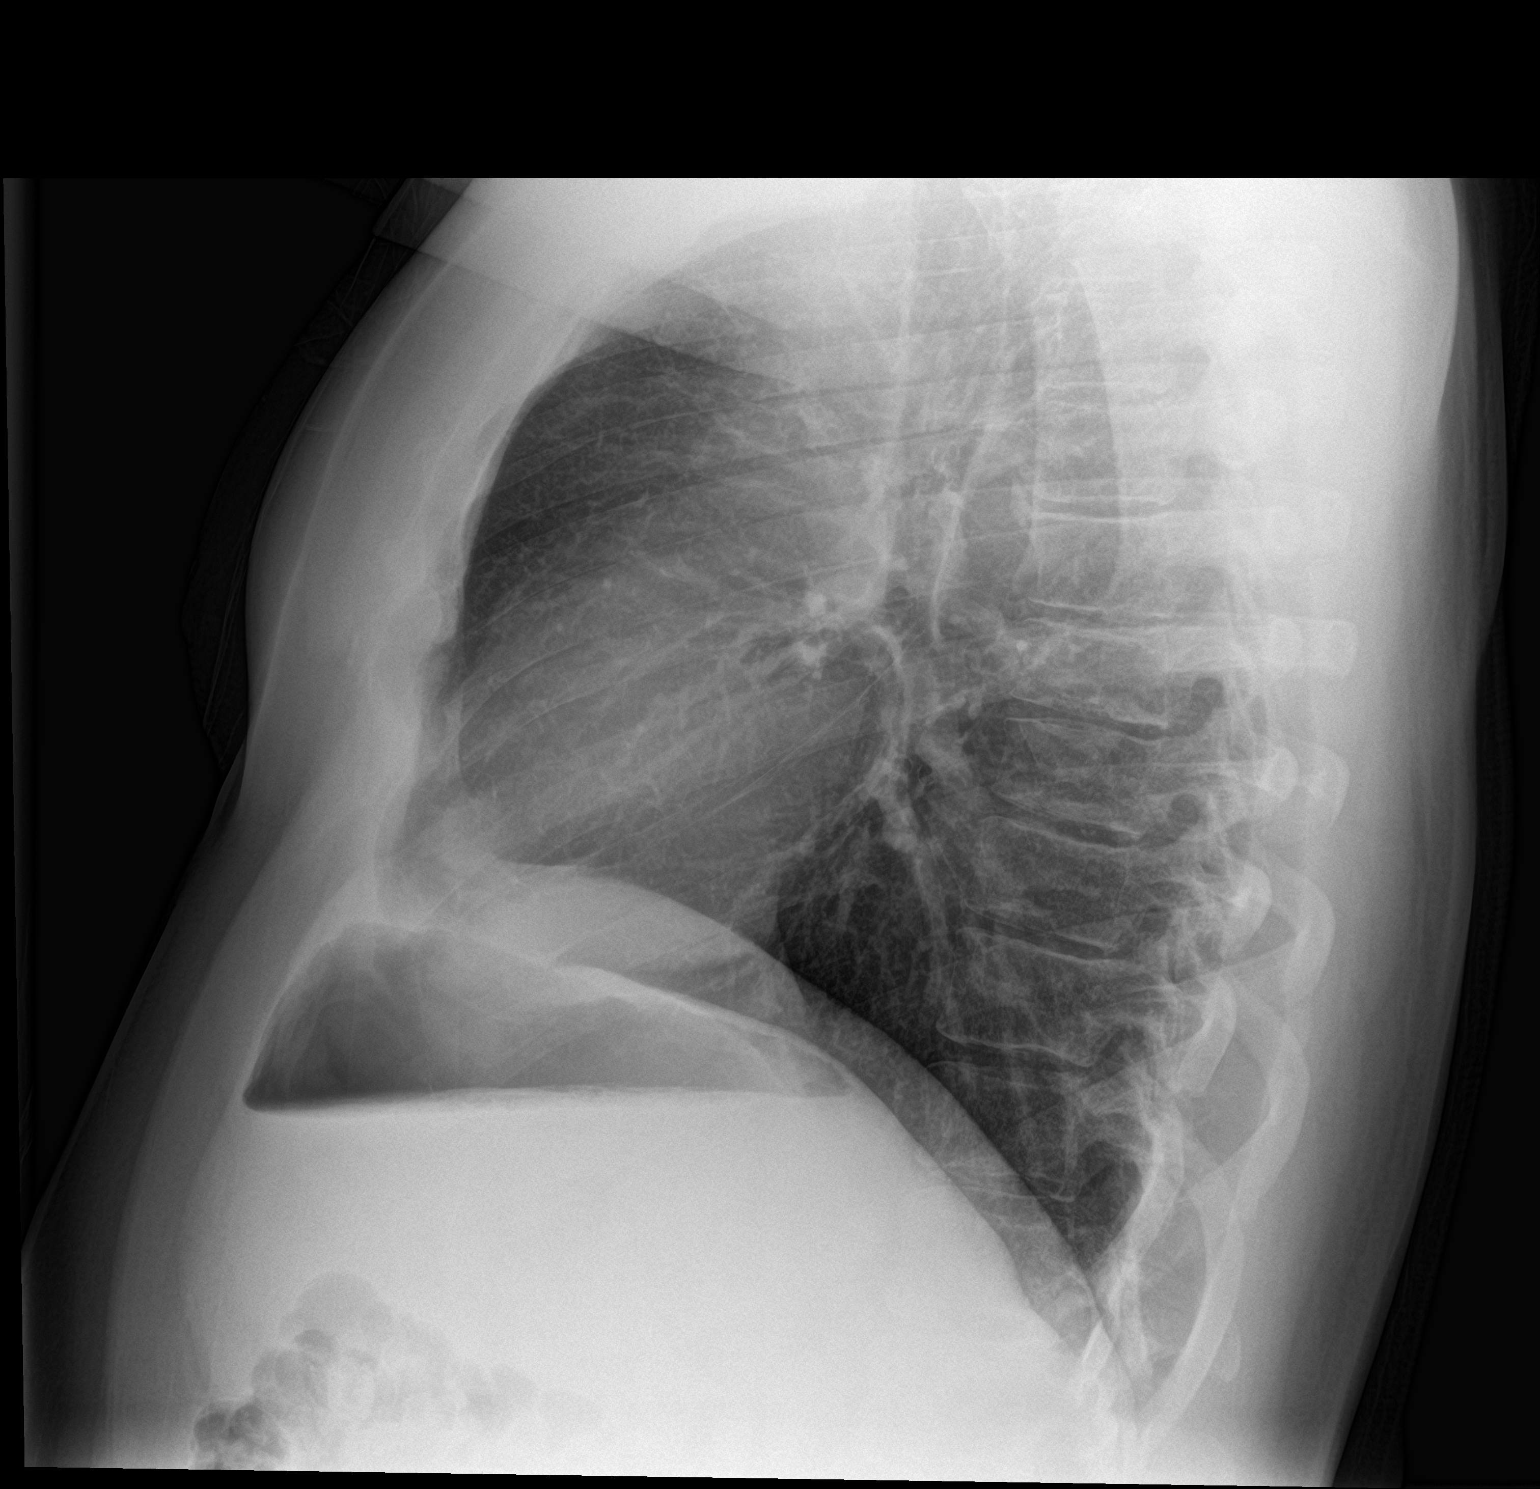

[2 of 2 positions shown; findings below may reference images not displayed]

FINDINGS: The heart size and mediastinal contours are within normal limits.
Both lungs are clear. The visualized skeletal structures are
unremarkable.
IMPRESSION: No active cardiopulmonary disease.

## 2020-09-08 ENCOUNTER — Ambulatory Visit
Admission: EM | Admit: 2020-09-08 | Discharge: 2020-09-08 | Disposition: A | Discharge status: Home / Self Care | Payer: PRIVATE HEALTH INSURANCE | Attending: Emergency Medicine | Admitting: Emergency Medicine

## 2020-09-08 ENCOUNTER — Other Ambulatory Visit: Payer: Self-pay

## 2020-09-08 DIAGNOSIS — H6693 Otitis media, unspecified, bilateral: Secondary | ICD-10-CM | POA: Diagnosis not present

## 2020-09-08 DIAGNOSIS — J069 Acute upper respiratory infection, unspecified: Secondary | ICD-10-CM

## 2020-09-08 MED ORDER — AMOXICILLIN 875 MG PO TABS: 875.0000 mg | ORAL_TABLET | Freq: Two times a day (BID) | ORAL | 0 refills | Status: AC

## 2020-09-08 NOTE — ED Triage Notes (Signed)
Pt reports having bila ear pain that began this morning.

## 2020-09-08 NOTE — Discharge Instructions (Signed)
Take the amoxicillin as directed.  Follow up with your primary care provider if your symptoms are not improving.   ° ° °

## 2020-09-08 NOTE — ED Provider Notes (Signed)
Douglas Garza    CSN: [id number] Arrival date & time: 09/08/20  0909      History   Chief Complaint Chief Complaint  Patient presents with  . Otalgia    HPI Douglas Garza is a 40 y.o. male.   Patient presents with bilateral ear pain since this morning.  He reports having a sore throat and cough x2 days.  He denies fever, chills, rash, shortness of breath, vomiting, diarrhea, or other symptoms.  Treatment attempted at home with NyQuil.  His medical history includes anxiety, depression, heart murmur.  The history is provided by the patient.    Past Medical History:  Diagnosis Date  . Depression   . Heart murmur     Patient Active Problem List   Diagnosis Date Noted  . Anxiety and depression 09/16/2012    History reviewed. No pertinent surgical history.     Home Medications    Prior to Admission medications   Medication Sig Start Date End Date Taking? Authorizing Provider  amoxicillin (AMOXIL) 875 MG tablet Take 1 tablet (875 mg total) by mouth 2 (two) times daily for 7 days. 09/08/20 09/15/20 Yes Mickie Bail, NP  buPROPion (WELLBUTRIN XL) 150 MG 24 hr tablet Take 1 tablet (150 mg total) by mouth daily. 01/03/17   Lorre Munroe, NP  FLUoxetine (PROZAC) 20 MG capsule Take 1 capsule (20 mg total) daily by mouth. 02/16/17   Lorre Munroe, NP    Family History Family History  Problem Relation Age of Onset  . Breast cancer Mother   . Drug abuse Brother   . Alcohol abuse Maternal Grandmother   . Lung cancer Maternal Grandfather   . Stomach cancer Paternal Grandmother   . Brain cancer Paternal Grandfather     Social History Social History   Tobacco Use  . Smoking status: Current Every Day Smoker    Types: E-cigarettes  . Smokeless tobacco: Never Used  . Tobacco comment: Daily  Vaping Use  . Vaping Use: Every day  Substance Use Topics  . Alcohol use: Yes    Alcohol/week: 8.0 standard drinks    Types: 8 Cans of beer per week    Comment: occasional   . Drug use: No     Allergies   Patient has no known allergies.   Review of Systems Review of Systems  Constitutional: Negative for chills and fever.  HENT: Positive for ear pain and sore throat.   Respiratory: Positive for cough. Negative for shortness of breath.   Cardiovascular: Negative for chest pain and palpitations.  Gastrointestinal: Negative for abdominal pain, diarrhea and vomiting.  Skin: Negative for color change and rash.  All other systems reviewed and are negative.    Physical Exam Triage Vital Signs ED Triage Vitals  Enc Vitals Group     BP      Pulse      Resp      Temp      Temp src      SpO2      Weight      Height      Head Circumference      Peak Flow      Pain Score      Pain Loc      Pain Edu?      Excl. in GC?    No data found.  Updated Vital Signs BP (!) 147/91   Pulse (!) 117   Temp 99.8 F (37.7 C) (Oral)   Resp 16  Ht 6\' 4"  (1.93 m)   Wt 240 lb (108.9 kg)   SpO2 99%   BMI 29.21 kg/m   Visual Acuity Right Eye Distance:   Left Eye Distance:   Bilateral Distance:    Right Eye Near:   Left Eye Near:    Bilateral Near:     Physical Exam Vitals and nursing note reviewed.  Constitutional:      General: He is not in acute distress.    Appearance: He is well-developed.  HENT:     Head: Normocephalic and atraumatic.     Right Ear: Tympanic membrane is erythematous.     Left Ear: Tympanic membrane is erythematous.     Nose: Nose normal.     Mouth/Throat:     Mouth: Mucous membranes are moist.     Pharynx: Oropharynx is clear.  Eyes:     Conjunctiva/sclera: Conjunctivae normal.  Cardiovascular:     Rate and Rhythm: Normal rate and regular rhythm.     Heart sounds: Normal heart sounds.  Pulmonary:     Effort: Pulmonary effort is normal. No respiratory distress.     Breath sounds: Normal breath sounds.  Abdominal:     Palpations: Abdomen is soft.     Tenderness: There is no abdominal tenderness.  Musculoskeletal:      Cervical back: Neck supple.  Skin:    General: Skin is warm and dry.  Neurological:     General: No focal deficit present.     Mental Status: He is alert and oriented to person, place, and time.     Gait: Gait normal.  Psychiatric:        Mood and Affect: Mood normal.        Behavior: Behavior normal.      UC Treatments / Results  Labs (all labs ordered are listed, but only abnormal results are displayed) Labs Reviewed  NOVEL CORONAVIRUS, NAA    EKG   Radiology No results found.  Procedures Procedures (including critical care time)  Medications Ordered in UC Medications - No data to display  Initial Impression / Assessment and Plan / UC Course  I have reviewed the triage vital signs and the nursing notes.  Pertinent labs & imaging results that were available during my care of the patient were reviewed by me and considered in my medical decision making (see chart for details).   Bilateral otitis media.  Viral URI with cough.  Treating ear infection with amoxicillin.  PCR COVID pending.  Instructed patient to self quarantine until the test result is back.  Discussed ongoing symptomatic treatment.  Instructed him to follow-up with his PCP if his symptoms are not improving.  He agrees to plan of care.   Final Clinical Impressions(s) / UC Diagnoses   Final diagnoses:  Bilateral otitis media, unspecified otitis media type  Viral URI with cough     Discharge Instructions     Take the amoxicillin as directed.    Follow up with your primary care provider if your symptoms are not improving.        ED Prescriptions    Medication Sig Dispense Auth. Provider   amoxicillin (AMOXIL) 875 MG tablet Take 1 tablet (875 mg total) by mouth 2 (two) times daily for 7 days. 14 tablet , NP     PDMP not reviewed this encounter.   Mickie Bail, NP 09/08/20 [phone]

## 2020-09-10 LAB — SARS-COV-2, NAA 2 DAY TAT

## 2020-09-10 LAB — NOVEL CORONAVIRUS, NAA: SARS-CoV-2, NAA: NOT DETECTED

## 2023-02-11 ENCOUNTER — Ambulatory Visit: Payer: PRIVATE HEALTH INSURANCE
